# Patient Record
Sex: Female | Born: 2000 | Hispanic: Yes | Marital: Single
Health system: Southern US, Community
[De-identification: ages and names within clinical notes are randomized; demographics above are authoritative.]

---
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
## 2020-05-06 IMAGING — US US ABDOMEN LIMITED
1 series · 11 of 11 positions shown · non-contrast
Comparison: None.

CLINICAL DATA: Intermittent RIGHT abdominal pain.

EXAM:
ULTRASOUND ABDOMEN LIMITED
TECHNIQUE: Gray scale imaging of the right lower quadrant was performed to
evaluate for suspected appendicitis. Standard imaging planes and
graded compression technique were utilized.

[Series 1: us abdomen limited · 0.13mm/px · 11 acquisitions, 11 frames shown]
[im 1/11]
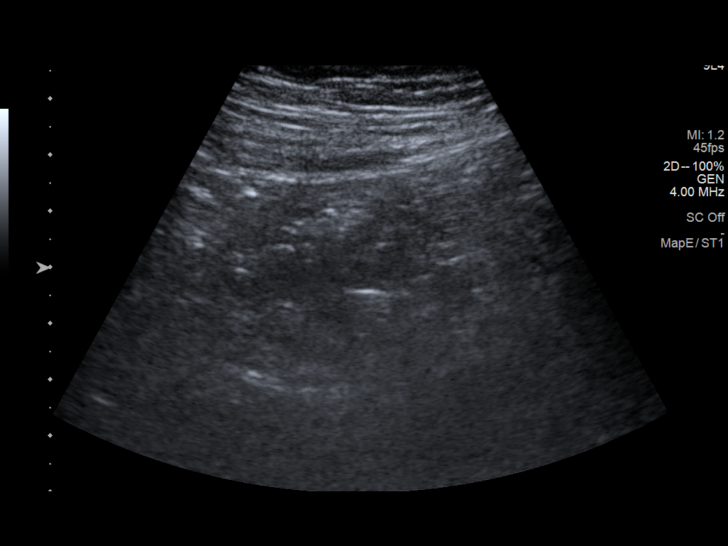
[im 2/11]
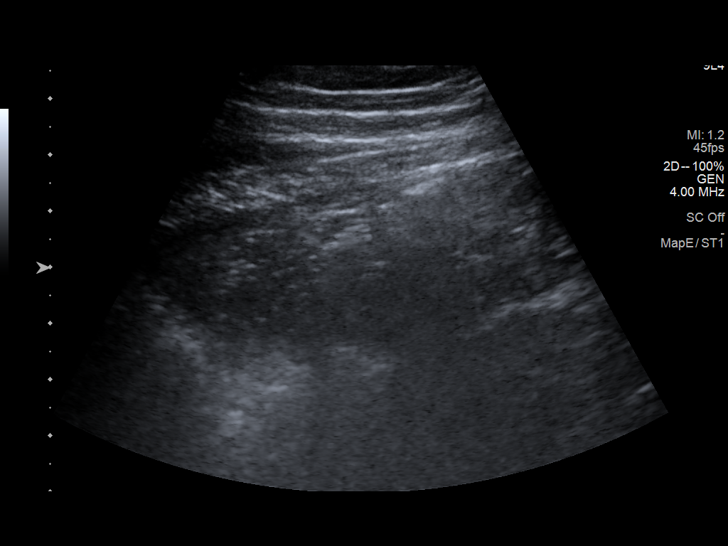
[im 3/11]
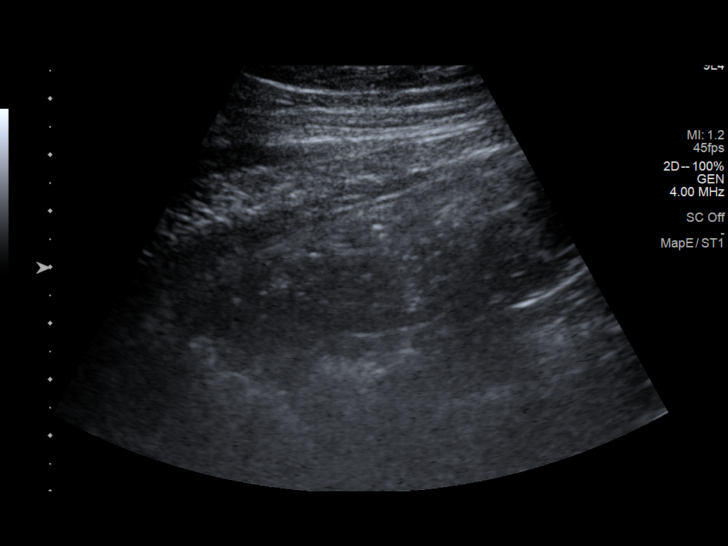
[im 4/11]
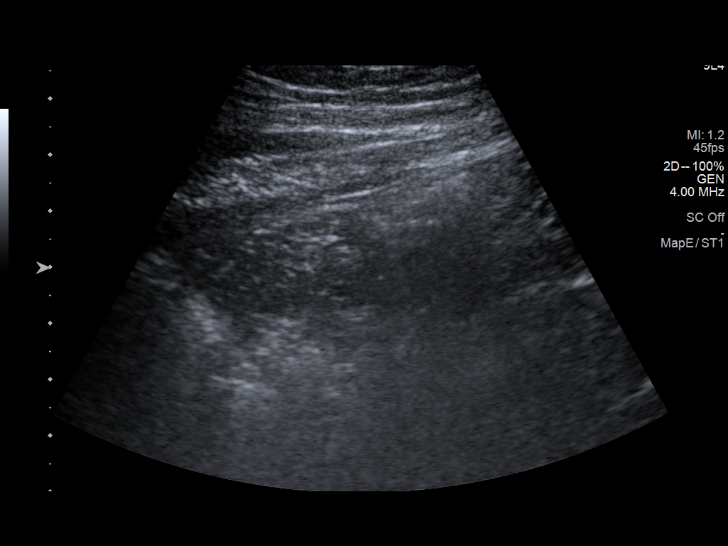
[im 5/11]
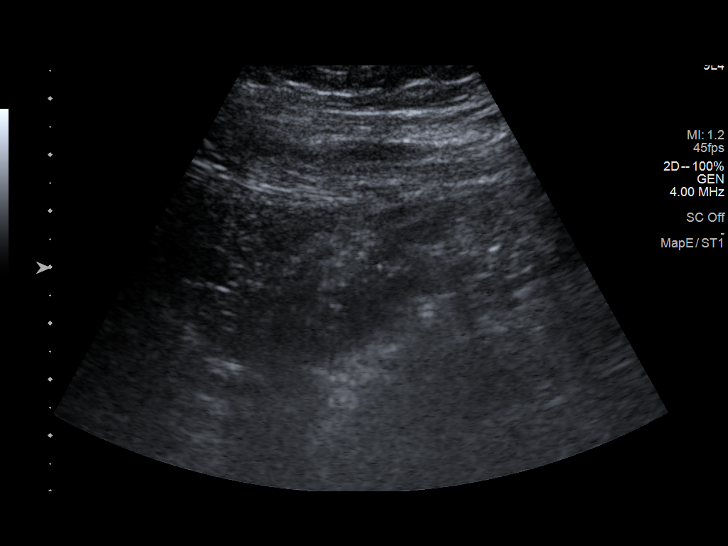
[im 6/11]
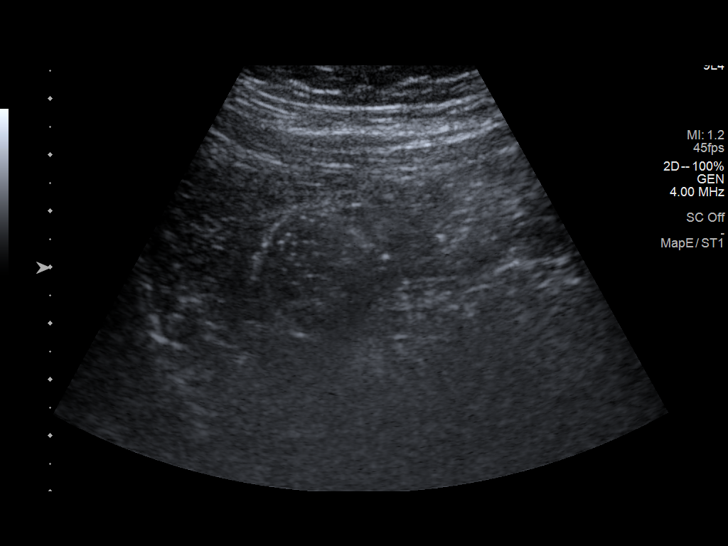
[im 7/11]
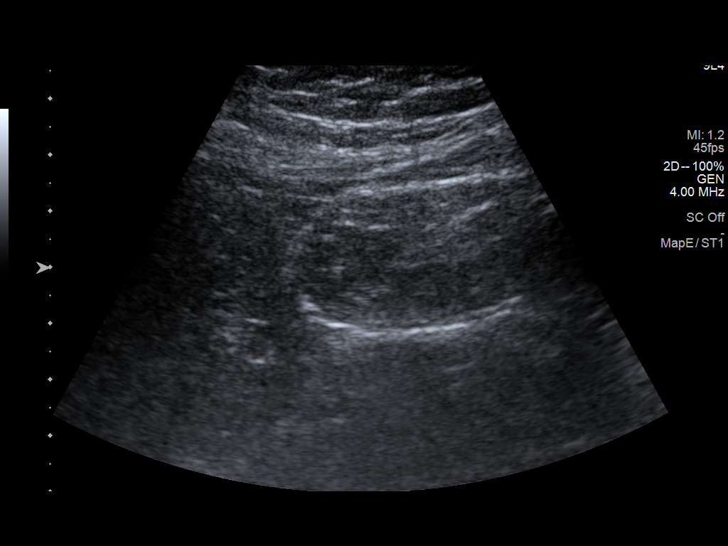
[im 8/11]
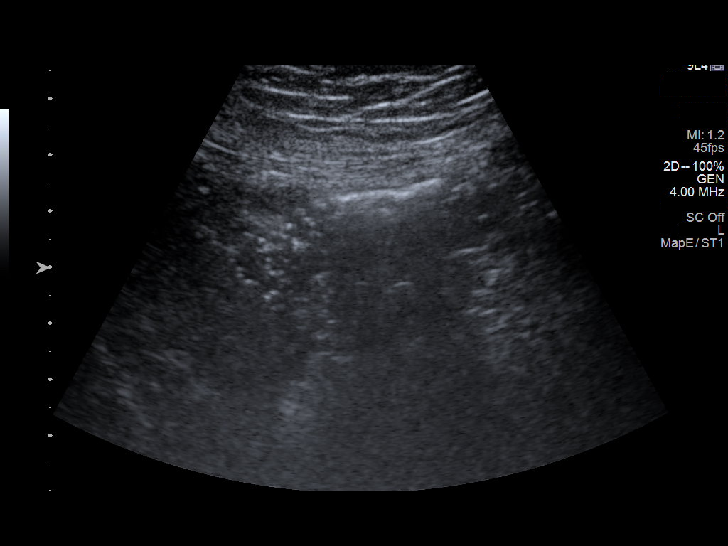
[im 9/11]
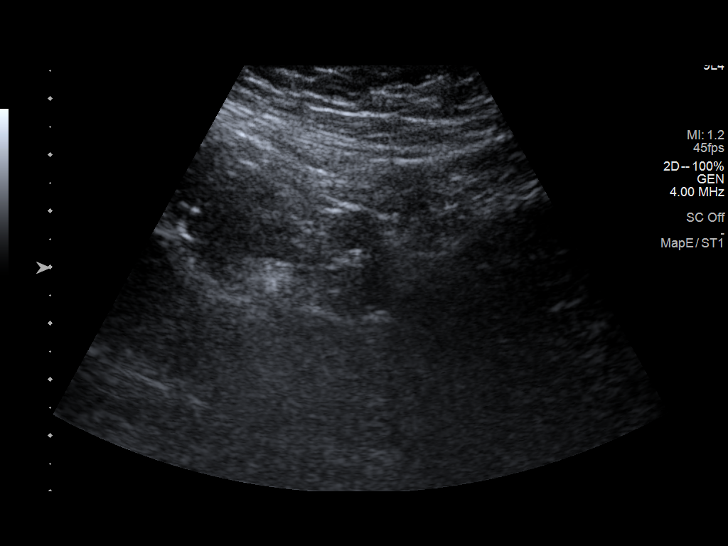
[im 10/11]
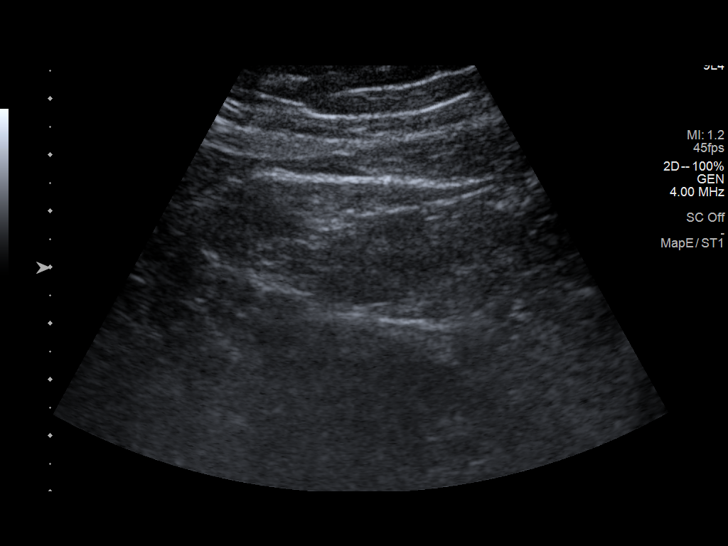
[im 11/11]
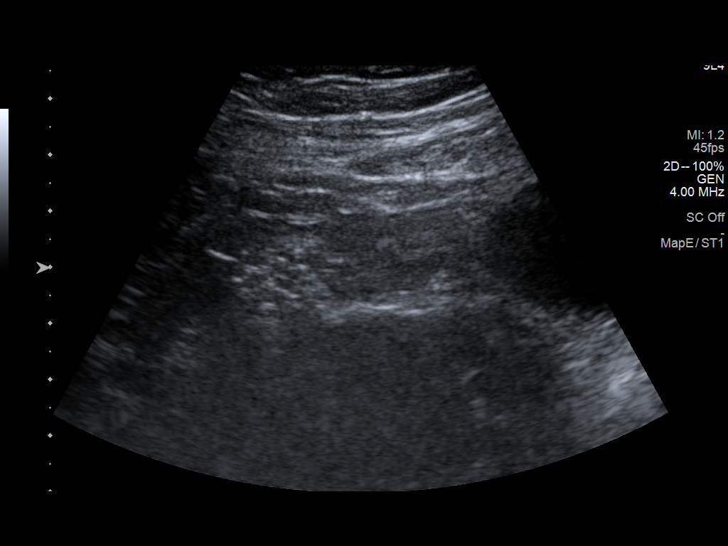

[11 of 11 positions shown; findings below may reference images not displayed]

FINDINGS: The appendix is not visualized.

Ancillary findings: None.

Factors affecting image quality: None. Multiple loops of
fluid-filled peristalsing bowel.
IMPRESSION: Nonvisualized appendix without ancillary findings of acute
appendicitis. Fluid-filled peristalsing bowel seen with enteritis.

Note: Non-visualization of appendix by US does not definitely
exclude appendicitis. If there is sufficient clinical concern,
consider abdomen pelvis CT with contrast for further evaluation.

## 2024-09-04 ENCOUNTER — Ambulatory Visit (HOSPITAL_BASED_OUTPATIENT_CLINIC_OR_DEPARTMENT_OTHER): Payer: Self-pay | Admitting: Family Medicine
